# Patient Record
Sex: Male | Born: 1980 | Race: White | Hispanic: No | Marital: Married | Smoking: Current every day smoker
Health system: Southern US, Community
[De-identification: ages and names within clinical notes are randomized; demographics above are authoritative.]

## PROBLEM LIST (undated history)

## (undated) DIAGNOSIS — F319 Bipolar disorder, unspecified: Secondary | ICD-10-CM

## (undated) DIAGNOSIS — F329 Major depressive disorder, single episode, unspecified: Secondary | ICD-10-CM

## (undated) DIAGNOSIS — F419 Anxiety disorder, unspecified: Secondary | ICD-10-CM

## (undated) DIAGNOSIS — F32A Depression, unspecified: Secondary | ICD-10-CM

## (undated) HISTORY — DX: Depression, unspecified: F32.A

## (undated) HISTORY — DX: Bipolar disorder, unspecified: F31.9

## (undated) HISTORY — DX: Anxiety disorder, unspecified: F41.9

## (undated) HISTORY — DX: Major depressive disorder, single episode, unspecified: F32.9

## (undated) HISTORY — PX: ABDOMINAL SURGERY: SHX537

---
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
## 2011-09-17 ENCOUNTER — Emergency Department: Payer: Self-pay | Admitting: Emergency Medicine

## 2011-09-17 LAB — ETHANOL: Ethanol: <3 mg/dL

## 2011-09-17 LAB — COMPREHENSIVE METABOLIC PANEL
Albumin: 4.1 g/dL (ref 3.4–5.0)
Calcium, Total: 8.9 mg/dL (ref 8.5–10.1)
Chloride: 108 mmol/L — ABNORMAL HIGH (ref 98–107)
Co2: 26 mmol/L (ref 21–32)
Creatinine: 1.25 mg/dL (ref 0.60–1.30)
EGFR (African American): >60
Osmolality: 283 (ref 275–301)
Sodium: 140 mmol/L (ref 136–145)
Total Protein: 7.3 g/dL (ref 6.4–8.2)

## 2011-09-17 LAB — SALICYLATE LEVEL: Salicylates, Serum: 6.4 mg/dL — ABNORMAL HIGH

## 2011-09-17 LAB — DRUG SCREEN, URINE
Amphetamines, Ur Screen: NEGATIVE (ref ?–1000)
Barbiturates, Ur Screen: NEGATIVE (ref ?–200)
Opiate, Ur Screen: NEGATIVE (ref ?–300)
Tricyclic, Ur Screen: NEGATIVE (ref ?–1000)

## 2011-09-17 LAB — ACETAMINOPHEN LEVEL: Acetaminophen: <2 ug/mL

## 2011-09-17 LAB — TSH: Thyroid Stimulating Horm: 1.05 u[IU]/mL

## 2011-09-17 LAB — CBC
HCT: 43.3 % (ref 40.0–52.0)
MCH: 32.3 pg (ref 26.0–34.0)

## 2014-08-19 DIAGNOSIS — F418 Other specified anxiety disorders: Secondary | ICD-10-CM | POA: Insufficient documentation

## 2014-08-19 DIAGNOSIS — F319 Bipolar disorder, unspecified: Secondary | ICD-10-CM | POA: Insufficient documentation

## 2014-08-19 DIAGNOSIS — F419 Anxiety disorder, unspecified: Secondary | ICD-10-CM

## 2014-08-24 ENCOUNTER — Ambulatory Visit: Payer: Self-pay | Admitting: Unknown Physician Specialty

## 2014-08-31 ENCOUNTER — Ambulatory Visit: Payer: Self-pay | Admitting: Unknown Physician Specialty

## 2014-09-01 ENCOUNTER — Ambulatory Visit: Payer: Self-pay | Admitting: Unknown Physician Specialty

## 2014-09-10 ENCOUNTER — Ambulatory Visit: Payer: Self-pay | Admitting: Family Medicine

## 2014-10-11 ENCOUNTER — Inpatient Hospital Stay
Admission: EM | Admit: 2014-10-11 | Discharge: 2014-10-11 | DRG: 380 | Payer: Self-pay | Attending: Internal Medicine | Admitting: Internal Medicine

## 2014-10-11 ENCOUNTER — Encounter: Payer: Self-pay | Admitting: Emergency Medicine

## 2014-10-11 ENCOUNTER — Inpatient Hospital Stay: Payer: Self-pay

## 2014-10-11 ENCOUNTER — Ambulatory Visit (HOSPITAL_COMMUNITY): Admission: AD | Admit: 2014-10-11 | Payer: Self-pay | Source: Other Acute Inpatient Hospital | Admitting: *Deleted

## 2014-10-11 ENCOUNTER — Ambulatory Visit (HOSPITAL_COMMUNITY)
Admission: AD | Admit: 2014-10-11 | Discharge: 2014-10-11 | Disposition: A | Discharge status: Home / Self Care | Payer: MEDICAID | Source: Other Acute Inpatient Hospital | Attending: Internal Medicine | Admitting: Internal Medicine

## 2014-10-11 DIAGNOSIS — N10 Acute tubulo-interstitial nephritis: Secondary | ICD-10-CM | POA: Diagnosis present

## 2014-10-11 DIAGNOSIS — N12 Tubulo-interstitial nephritis, not specified as acute or chronic: Secondary | ICD-10-CM | POA: Diagnosis present

## 2014-10-11 DIAGNOSIS — F17210 Nicotine dependence, cigarettes, uncomplicated: Secondary | ICD-10-CM | POA: Diagnosis present

## 2014-10-11 DIAGNOSIS — R000 Tachycardia, unspecified: Secondary | ICD-10-CM | POA: Diagnosis present

## 2014-10-11 DIAGNOSIS — E876 Hypokalemia: Secondary | ICD-10-CM | POA: Diagnosis present

## 2014-10-11 DIAGNOSIS — F319 Bipolar disorder, unspecified: Secondary | ICD-10-CM | POA: Diagnosis present

## 2014-10-11 DIAGNOSIS — K265 Chronic or unspecified duodenal ulcer with perforation: Principal | ICD-10-CM | POA: Diagnosis present

## 2014-10-11 DIAGNOSIS — A419 Sepsis, unspecified organism: Secondary | ICD-10-CM | POA: Diagnosis present

## 2014-10-11 DIAGNOSIS — I81 Portal vein thrombosis: Secondary | ICD-10-CM | POA: Diagnosis present

## 2014-10-11 DIAGNOSIS — B9789 Other viral agents as the cause of diseases classified elsewhere: Secondary | ICD-10-CM | POA: Diagnosis present

## 2014-10-11 DIAGNOSIS — I959 Hypotension, unspecified: Secondary | ICD-10-CM | POA: Diagnosis present

## 2014-10-11 LAB — CBC WITH DIFFERENTIAL/PLATELET
BASOS ABS: 0 10*3/uL (ref 0–0.1)
BASOS PCT: 0 %
EOS PCT: 0 %
Eosinophils Absolute: 0 10*3/uL (ref 0–0.7)
HCT: 35.8 % — ABNORMAL LOW (ref 40.0–52.0)
Hemoglobin: 12.5 g/dL — ABNORMAL LOW (ref 13.0–18.0)
LYMPHS PCT: 1 %
Lymphs Abs: 0.1 10*3/uL — ABNORMAL LOW (ref 1.0–3.6)
MCH: 30.6 pg (ref 26.0–34.0)
MCHC: 34.9 g/dL (ref 32.0–36.0)
MCV: 87.6 fL (ref 80.0–100.0)
Monocytes Absolute: 0 10*3/uL — ABNORMAL LOW (ref 0.2–1.0)
Monocytes Relative: 0 %
Neutro Abs: 12 10*3/uL — ABNORMAL HIGH (ref 1.4–6.5)
Neutrophils Relative %: 99 %
Platelets: 249 10*3/uL (ref 150–440)
RBC: 4.08 MIL/uL — ABNORMAL LOW (ref 4.40–5.90)
RDW: 14.1 % (ref 11.5–14.5)
WBC: 12.2 10*3/uL — ABNORMAL HIGH (ref 3.8–10.6)

## 2014-10-11 LAB — BASIC METABOLIC PANEL
Anion gap: 17 — ABNORMAL HIGH (ref 5–15)
BUN: 11 mg/dL (ref 6–20)
CHLORIDE: 90 mmol/L — AB (ref 101–111)
CO2: 27 mmol/L (ref 22–32)
Calcium: 8 mg/dL — ABNORMAL LOW (ref 8.9–10.3)
Creatinine, Ser: 0.82 mg/dL (ref 0.61–1.24)
GFR calc Af Amer: >60 mL/min (ref >60)
GFR calc non Af Amer: >60 mL/min (ref >60)
Glucose, Bld: 84 mg/dL (ref 65–99)
Potassium: 2.4 mmol/L — CL (ref 3.5–5.1)
Sodium: 134 mmol/L — ABNORMAL LOW (ref 135–145)

## 2014-10-11 LAB — URINALYSIS COMPLETE WITH MICROSCOPIC (ARMC ONLY)
GLUCOSE, UA: NEGATIVE mg/dL
KETONES UR: NEGATIVE mg/dL
Leukocytes, UA: NEGATIVE
NITRITE: NEGATIVE
PH: 5 (ref 5.0–8.0)
Protein, ur: 30 mg/dL — AB
SPECIFIC GRAVITY, URINE: 1.014 (ref 1.005–1.030)

## 2014-10-11 LAB — MAGNESIUM: Magnesium: 1.4 mg/dL — ABNORMAL LOW (ref 1.7–2.4)

## 2014-10-11 MED ORDER — POTASSIUM CHLORIDE 20 MEQ/15ML (10%) PO SOLN
20.0000 meq | Freq: Two times a day (BID) | ORAL | Status: DC
  Filled 2014-10-11 (×2): qty 15

## 2014-10-11 MED ORDER — IOHEXOL 350 MG/ML SOLN
100.0000 mL | Freq: Once | INTRAVENOUS | Status: AC | PRN
  Administered 2014-10-11: 100 mL via INTRAVENOUS

## 2014-10-11 MED ORDER — SODIUM CHLORIDE 0.9 % IV SOLN
8.0000 mg/h | INTRAVENOUS | Status: DC
  Administered 2014-10-11: 8 mg/h via INTRAVENOUS
  Filled 2014-10-11: qty 80

## 2014-10-11 MED ORDER — MORPHINE SULFATE 4 MG/ML IJ SOLN
INTRAMUSCULAR | Status: AC
  Administered 2014-10-11: 4 mg via INTRAVENOUS
  Filled 2014-10-11: qty 1

## 2014-10-11 MED ORDER — FLUCONAZOLE IN SODIUM CHLORIDE 400-0.9 MG/200ML-% IV SOLN
400.0000 mg | Freq: Once | INTRAVENOUS | Status: DC
  Filled 2014-10-11: qty 200

## 2014-10-11 MED ORDER — SODIUM CHLORIDE 0.9 % IJ SOLN: 3.0000 mL | Freq: Two times a day (BID) | INTRAMUSCULAR | Status: DC

## 2014-10-11 MED ORDER — ONDANSETRON HCL 4 MG PO TABS: 4.0000 mg | ORAL_TABLET | Freq: Four times a day (QID) | ORAL | Status: DC | PRN

## 2014-10-11 MED ORDER — ACETAMINOPHEN 325 MG PO TABS: 650.0000 mg | ORAL_TABLET | Freq: Four times a day (QID) | ORAL | Status: DC | PRN

## 2014-10-11 MED ORDER — ONDANSETRON HCL 4 MG/2ML IJ SOLN
4.0000 mg | Freq: Once | INTRAMUSCULAR | Status: AC
  Administered 2014-10-11: 4 mg via INTRAVENOUS

## 2014-10-11 MED ORDER — POTASSIUM CHLORIDE IN NACL 20-0.9 MEQ/L-% IV SOLN
INTRAVENOUS | Status: DC
  Administered 2014-10-11: 10:00:00 via INTRAVENOUS
  Filled 2014-10-11 (×4): qty 1000

## 2014-10-11 MED ORDER — SODIUM CHLORIDE 0.9 % IV BOLUS (SEPSIS)
1000.0000 mL | Freq: Once | INTRAVENOUS | Status: AC
Start: 2014-10-11 — End: 2014-10-11
  Administered 2014-10-11: 1000 mL via INTRAVENOUS

## 2014-10-11 MED ORDER — CLONAZEPAM 0.5 MG PO TABS: 0.5000 mg | ORAL_TABLET | Freq: Three times a day (TID) | ORAL | Status: DC | PRN

## 2014-10-11 MED ORDER — PIPERACILLIN-TAZOBACTAM 3.375 G IVPB
3.3750 g | Freq: Four times a day (QID) | INTRAVENOUS | Status: DC
  Administered 2014-10-11: 3.375 g via INTRAVENOUS
  Filled 2014-10-11 (×5): qty 50

## 2014-10-11 MED ORDER — ACETAMINOPHEN 650 MG RE SUPP: 650.0000 mg | Freq: Four times a day (QID) | RECTAL | Status: DC | PRN

## 2014-10-11 MED ORDER — CITALOPRAM HYDROBROMIDE 20 MG PO TABS: 40.0000 mg | ORAL_TABLET | Freq: Every day | ORAL | Status: DC

## 2014-10-11 MED ORDER — MORPHINE SULFATE 4 MG/ML IJ SOLN
4.0000 mg | Freq: Once | INTRAMUSCULAR | Status: AC
  Administered 2014-10-11: 4 mg via INTRAVENOUS

## 2014-10-11 MED ORDER — ONDANSETRON HCL 4 MG/2ML IJ SOLN
4.0000 mg | Freq: Four times a day (QID) | INTRAMUSCULAR | Status: DC | PRN
  Administered 2014-10-11: 4 mg via INTRAVENOUS

## 2014-10-11 MED ORDER — LITHIUM CARBONATE ER 300 MG PO TBCR: 300.0000 mg | EXTENDED_RELEASE_TABLET | Freq: Two times a day (BID) | ORAL | Status: DC

## 2014-10-11 MED ORDER — IOHEXOL 240 MG/ML SOLN
25.0000 mL | Freq: Once | INTRAMUSCULAR | Status: AC | PRN
  Administered 2014-10-11: 25 mL via ORAL

## 2014-10-11 MED ORDER — LACTATED RINGERS IV BOLUS (SEPSIS)
1000.0000 mL | Freq: Once | INTRAVENOUS | Status: AC
  Administered 2014-10-11: 1000 mL via INTRAVENOUS
  Filled 2014-10-11: qty 1000

## 2014-10-11 MED ORDER — DEXTROSE 5 % IV SOLN
1.0000 g | Freq: Once | INTRAVENOUS | Status: AC
  Administered 2014-10-11: 1 g via INTRAVENOUS
  Filled 2014-10-11: qty 10

## 2014-10-11 MED ORDER — PANTOPRAZOLE SODIUM 40 MG IV SOLR: 40.0000 mg | Freq: Two times a day (BID) | INTRAVENOUS | Status: DC

## 2014-10-11 MED ORDER — ONDANSETRON HCL 4 MG/2ML IJ SOLN
INTRAMUSCULAR | Status: AC
  Administered 2014-10-11: 4 mg via INTRAVENOUS
  Filled 2014-10-11: qty 2

## 2014-10-11 MED ORDER — HYDROCODONE-ACETAMINOPHEN 5-325 MG PO TABS
1.0000 | ORAL_TABLET | ORAL | Status: DC | PRN
  Administered 2014-10-11 (×2): 1 via ORAL
  Filled 2014-10-11 (×2): qty 1

## 2014-10-11 MED ORDER — SENNOSIDES-DOCUSATE SODIUM 8.6-50 MG PO TABS: 1.0000 | ORAL_TABLET | Freq: Every evening | ORAL | Status: DC | PRN

## 2014-10-11 MED ORDER — ALUM & MAG HYDROXIDE-SIMETH 200-200-20 MG/5ML PO SUSP: 30.0000 mL | Freq: Four times a day (QID) | ORAL | Status: DC | PRN

## 2014-10-11 MED ORDER — SODIUM CHLORIDE 0.9 % IV BOLUS (SEPSIS)
1000.0000 mL | Freq: Once | INTRAVENOUS | Status: AC
  Administered 2014-10-11: 1000 mL via INTRAVENOUS

## 2014-10-11 MED ORDER — HEPARIN SODIUM (PORCINE) 5000 UNIT/ML IJ SOLN: 5000.0000 [IU] | Freq: Three times a day (TID) | INTRAMUSCULAR | Status: DC

## 2014-10-11 MED ORDER — POTASSIUM CHLORIDE CRYS ER 20 MEQ PO TBCR
40.0000 meq | EXTENDED_RELEASE_TABLET | Freq: Once | ORAL | Status: AC
  Administered 2014-10-11: 40 meq via ORAL
  Filled 2014-10-11: qty 2

## 2014-10-11 MED ORDER — ONDANSETRON HCL 4 MG/2ML IJ SOLN
INTRAMUSCULAR | Status: AC
Start: 2014-10-11 — End: 2014-10-11
  Filled 2014-10-11: qty 2

## 2014-10-11 MED ORDER — PANTOPRAZOLE SODIUM 40 MG PO TBEC: 40.0000 mg | DELAYED_RELEASE_TABLET | Freq: Every day | ORAL | Status: DC

## 2014-10-11 MED ORDER — SODIUM CHLORIDE 0.9 % IV SOLN
80.0000 mg | Freq: Once | INTRAVENOUS | Status: AC
  Administered 2014-10-11: 80 mg via INTRAVENOUS
  Filled 2014-10-11: qty 80

## 2014-10-11 NOTE — ED Notes (Signed)
Dr. Egbert Garibaldi at bedside assessing patient.

## 2014-10-11 NOTE — ED Provider Notes (Signed)
[facility] Emergency Department Provider Note   ____________________________________________  Time seen: 0615  I have reviewed the triage vital signs and the nursing notes.   HISTORY  Chief Complaint Back Pain   History limited by: Not Limited   HPI Tristan Harrington is a 34 y.o. male who presents to the emergency department today with bilateral flank pain. He states that this started suddenly last night. He states the pain is severe. It has been constant. He states that he had similar symptoms a couple of months ago. He was admitted at that time for pyelonephritis. He states that they were not sure why he got a lateral pyelonephritis. He states since discharge he did finish a course of antibiotics and have been doing okay.   Past Medical History  Diagnosis Date  . Anxiety   . Bipolar disorder   . Depression     Patient Active Problem List   Diagnosis Date Noted  . Bipolar disorder 08/19/2014  . Depression with anxiety 08/19/2014  . Acute anxiety 08/19/2014    History reviewed. No pertinent past surgical history.  Current Outpatient Rx  Name  Route  Sig  Dispense  Refill  . citalopram (CELEXA) 40 MG tablet   Oral   Take 40 mg by mouth at bedtime.         . clonazePAM (KLONOPIN) 0.5 MG tablet   Oral   Take 0.5 mg by mouth 3 (three) times daily as needed for anxiety.         Marland Kitchen lithium carbonate (LITHOBID) 300 MG CR tablet   Oral   Take 300 mg by mouth 2 (two) times daily.         Marland Kitchen omeprazole (PRILOSEC) 20 MG capsule   Oral   Take 20 mg by mouth daily.           Allergies Review of patient's allergies indicates no known allergies.  History reviewed. No pertinent family history.  Social History History  Substance Use Topics  . Smoking status: Current Every Day Smoker -- 0.75 packs/day  . Smokeless tobacco: Not on file  . Alcohol Use: No    Review of Systems  Constitutional: Negative for fever. Cardiovascular:  Negative for chest pain. Respiratory: Negative for shortness of breath. Gastrointestinal: Bilateral flank pain Genitourinary: Negative for dysuria. Musculoskeletal: Negative for back pain. Skin: Negative for rash. Neurological: Negative for headaches, focal weakness or numbness.  10-point ROS otherwise negative.  ____________________________________________   PHYSICAL EXAM:  VITAL SIGNS: ED Triage Vitals  Enc Vitals Group     BP 10/11/14 0332 123/64 mmHg     Pulse Rate 10/11/14 0332 133     Resp 10/11/14 0336 19     Temp 10/11/14 0336 99.9 F (37.7 C)     Temp Source 10/11/14 0336 Oral     SpO2 10/11/14 0332 92 %     Weight 10/11/14 0336 190 lb (86.183 kg)     Height 10/11/14 0336 6\' 2"  (1.88 m)     Head Cir --      Peak Flow --      Pain Score 10/11/14 0337 10   Constitutional: Alert and oriented. Appears ill Eyes: Conjunctivae are normal. PERRL. Normal extraocular movements. ENT   Head: Normocephalic and atraumatic.   Nose: No congestion/rhinnorhea.   Mouth/Throat: Mucous membranes are moist.   Neck: No stridor. Hematological/Lymphatic/Immunilogical: No cervical lymphadenopathy. Cardiovascular: Tachycardic, regular rhythm.  No murmurs, rubs, or gallops. Respiratory: Normal respiratory effort without tachypnea nor retractions.  Breath sounds are clear and equal bilaterally. No wheezes/rales/rhonchi. Gastrointestinal: Soft and minimally tender. Bilateral CVA tenderness. Genitourinary: Deferred Musculoskeletal: Normal range of motion in all extremities. No joint effusions.  No lower extremity tenderness nor edema. Neurologic:  Normal speech and language. No gross focal neurologic deficits are appreciated. Speech is normal.  Skin:  Skin is warm, dry and intact. No rash noted. Psychiatric: Mood and affect are normal. Speech and behavior are normal. Patient exhibits appropriate insight and judgment.  ____________________________________________    LABS  (pertinent positives/negatives)  Labs Reviewed  CBC WITH DIFFERENTIAL/PLATELET - Abnormal; Notable for the following:    WBC 12.2 (*)    RBC 4.08 (*)    Hemoglobin 12.5 (*)    HCT 35.8 (*)    Neutro Abs 12.0 (*)    Lymphs Abs 0.1 (*)    Monocytes Absolute 0.0 (*)    All other components within normal limits  BASIC METABOLIC PANEL - Abnormal; Notable for the following:    Sodium 134 (*)    Potassium 2.4 (*)    Chloride 90 (*)    Calcium 8.0 (*)    Anion gap 17 (*)    All other components within normal limits  URINALYSIS COMPLETEWITH MICROSCOPIC ([facility] ONLY) - Abnormal; Notable for the following:    Color, Urine AMBER (*)    APPearance CLOUDY (*)    Bilirubin Urine 2+ (*)    Hgb urine dipstick 1+ (*)    Protein, ur 30 (*)    Bacteria, UA RARE (*)    Squamous Epithelial / LPF 0-5 (*)    All other components within normal limits  URINE CULTURE     ____________________________________________   EKG  None  ____________________________________________    RADIOLOGY  None  ____________________________________________   PROCEDURES  Procedure(s) performed: None  Critical Care performed: No  ____________________________________________   INITIAL IMPRESSION / ASSESSMENT AND PLAN / ED COURSE  Pertinent labs & imaging results that were available during my care of the patient were reviewed by me and considered in my medical decision making (see chart for details).  Patient presents with bilateral flank pain. Urine is concerning for infection. Physical exam concerning for pyelonephritis. Patient had pyelonephritis a couple of months ago. At this point will start IV antibiotics give fluids and plan on admission to the hospital.  ____________________________________________   FINAL CLINICAL IMPRESSION(S) / ED DIAGNOSES  Final diagnoses:  Pyelonephritis  Sepsis, due to unspecified organism     Phineas Semen, MD 10/11/14 [phone]

## 2014-10-11 NOTE — ED Notes (Signed)
Report called to floor.  Will transfer patient to floor after CT scan.  Patient informed.

## 2014-10-11 NOTE — ED Notes (Signed)
New orders placed, await NS 1L bolus infusing, will call Dr Mitzi Hansen after bolus.

## 2014-10-11 NOTE — Progress Notes (Signed)
Patient ID: Tristan Harrington, male   DOB: Dec 17, 1980, 34 y.o.   MRN: [id number]   I personally reviewed CT scan. 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 Oral contrast seems limited but was administered. There is a small amount of microperforation present seen within the duodenal sweep.  He currently is feeling better.  Distally blood pressure is 108 mm hg.  Exam is unchanged.   Multiple family members are present.  Discussed this case with the hospitalist. Given the fact of the delay in diagnosis from the initial CT scan as well as a portal vein thrombosis and young age I recommended to the hospitalist service transfer to [facility] medical centers for tertiary level care.  As I believe that he does not requiring urgent operation for duodenal perforation he certainly needs to be anticoagulated for the portal vein thrombosis and this certainly may complicate things here. Both patient and family and hospitalist are in agreement.  All questions were answered of the family.

## 2014-10-11 NOTE — ED Notes (Signed)
Pt to ED via EMS from home c/o bilateral kidney and back pain.  Pt states awoken from sleep tonight around 0215 with sharp bilateral kidney pain and generalized back pain.  Reports nausea but denies vomiting or diarrhea.  Pt reports recent kidney infection on July 1st and finished antibiotic treatment.  EMS given 650 tylenol and NS en route.  Pt is A&Ox4, speaking in complete and coherent sentences and in NAD at this time.

## 2014-10-11 NOTE — ED Notes (Signed)
Radiology called with critical value, perforated duodenal ulcer, notified Dr Juliene Pina.  New orders recd.

## 2014-10-11 NOTE — H&P (Addendum)
[facility] [place] at [facility]   PATIENT NAME: Tristan Harrington    MR#:  [id number]  DATE OF BIRTH:  1980-08-20  DATE OF ADMISSION:  10/11/2014  PRIMARY CARE PHYSICIAN: No primary care provider on file.   REQUESTING/REFERRING PHYSICIAN: Dr. Derrill Kay  CHIEF COMPLAINT:  Back pain HISTORY OF PRESENT ILLNESS:  Tristan Harrington  is a 34 y.o. male with a known history of pyelonephritis and tobacco dependence who presents with above complaint. Patient reports that since last night he had bilateral back pain and dysuria. He was recently admitted to the hospital with pyelonephritis and has finished his antibiotics since then. In the emergency room it was noted he has low blood pressure and was started on Rocephin.  PAST MEDICAL HISTORY:   Past Medical History  Diagnosis Date  . Anxiety   . Bipolar disorder   . Depression     PAST SURGICAL HISTORY:  None  SOCIAL HISTORY:   History  Substance Use Topics  . Smoking status: Current Every Day Smoker -- 0.75 packs/day  . Smokeless tobacco: Not on file  . Alcohol Use: No    FAMILY HISTORY:  No history of CAD, CVA or hypertension  DRUG ALLERGIES:  No Known Allergies   REVIEW OF SYSTEMS:  CONSTITUTIONAL: No fever, fatigue or weakness.  EYES: No blurred or double vision.  EARS, NOSE, AND THROAT: No tinnitus or ear pain.  RESPIRATORY: No cough, shortness of breath, wheezing or hemoptysis.  CARDIOVASCULAR: No chest pain, orthopnea, edema.  GASTROINTESTINAL: No nausea, vomiting, diarrhea or abdominal pain.  GENITOURINARY: Positive dysuria, no hematuria.  ENDOCRINE: No polyuria, nocturia,  HEMATOLOGY: No anemia, easy bruising or bleeding SKIN: No rash or lesion. MUSCULOSKELETAL: No joint pain or arthritis.  Positive bilateral back pain   NEUROLOGIC: No tingling, numbness, weakness.  PSYCHIATRY: Positive for bipolar not on medications  MEDICATIONS AT HOME:      VITAL SIGNS:  Blood pressure 91/64,  pulse 106, temperature 99.9 F (37.7 C), temperature source Oral, resp. rate 22, height 6\' 2"  (1.88 m), weight 86.183 kg (190 lb), SpO2 96 %.  PHYSICAL EXAMINATION:  GENERAL:  34 y.o.-year-old patient lying in the bed with no acute distress.  EYES: Pupils equal, round, reactive to light and accommodation. No scleral icterus. Extraocular muscles intact.  HEENT: Head atraumatic, normocephalic. Oropharynx and nasopharynx clear.  NECK:  Supple, no jugular venous distention. No thyroid enlargement, no tenderness.  LUNGS: Normal breath sounds bilaterally, no wheezing, rales,rhonchi or crepitation. No use of accessory muscles of respiration.  CARDIOVASCULAR: S1, S2 normal. No murmurs, rubs, or gallops.  ABDOMEN: Soft, nontender, nondistended. Bowel sounds present. No organomegaly or mass. Positive bilateral CVA tenderness EXTREMITIES: No pedal edema, cyanosis, or clubbing.  NEUROLOGIC: Cranial nerves II through XII are grossly intact. No focal deficits. PSYCHIATRIC: The patient is alert and oriented x 3.  SKIN: No obvious rash, lesion, or ulcer.   LABORATORY PANEL:   CBC  Recent Labs Lab 10/11/14 0340  WBC 12.2*  HGB 12.5*  HCT 35.8*  PLT 249   ------------------------------------------------------------------------------------------------------------------  Chemistries   Recent Labs Lab 10/11/14 0340  NA 134*  K 2.4*  CL 90*  CO2 27  GLUCOSE 84  BUN 11  CREATININE 0.82  CALCIUM 8.0*   ------------------------------------------------------------------------------------------------------------------  Cardiac Enzymes No results for input(s): TROPONINI in the last 168 hours. ------------------------------------------------------------------------------------------------------------------  RADIOLOGY:  No results found.  EKG:    IMPRESSION AND PLAN:  34 year old male who was recently treated for acute polynephritis who  presents again with bilateral acute pyelonephritis  and sepsis.  1. Sepsis: Cyst likely secondary to acute pyelonephritis. Patient continues to have low blood pressure but is asymptomatic from his hypotension. His map is above 65. I will continue aggressive IV hydration. Patient has received 3 L of normal saline. Patient will be on antibiotics for his pyelonephritis. Due to his low blood pressure I will order CT scan of abdomen/pelvis.  2. Pyelonephritis: It is unclear why the patient has this again. He is currently on Rocephin which I'll continue. Urine and blood cultures been ordered. Patient will require a urology follow-up after discharge as this is his second case of pyelonephritis.  3. Tobacco dependence: Patient was encouraged to stop smoking. Patient does not want nicotine patch at this time. Patient was counseled for 3 minutes.  4. Tachycardia: This is secondary to sepsis. Continue to monitor.    5. Hypokalemia: Patient's potassium will be repleted. Magnesium level also checked.    All the records are reviewed and case discussed with ED provider. Management plans discussed with the patient and he is in agreement.  CODE STATUS: Full  TOTAL TIME TAKING CARE OF THIS PATIENT: 40 minutes.    Tristan Harrington M.D on 10/11/2014 at 10:23 AM  Between 7am to 6pm - Pager - [phone] After 6pm go to [email or web address] - password [email or web address]  [facility]  Office  [phone]  CC: Primary care physician; No primary care provider on file.

## 2014-10-11 NOTE — Progress Notes (Signed)
ER had attempted to admit this pt early this shift but BP was very low and pt was tachycardic. After fluid boluses BP improved according to the ER RN Viviann Spare and pt was to be admitted.Report taken from [facility], [place].Pt arrived from ER after a CT scan. VS checked , tele applied and admission profile set up. Pt was admitted for pyelonephritis but CT showed perforation and hepatic portal thrombosis per Dr Valla Leaver was admitted for pyelonephritis but CT showed perforation and hepatic portal thrombosis per Dr Egbert Garibaldi. RN received call from [facility] stating that they had a bed for the pt. RN was unaware of any transfer. Dr. Juliene Pina was called and she responded that she had put in an order to transfer that pt to stepdown at 0915. Order was seen but pt had not been admitted to floor until 1350. Also, the pt had a potassium of 2.4 which had not been addressed and a medication was ordered but it was unknown if it was given. Md entered order to transfer to [facility] Report called to [facility], SICU to [facility] was called for transport and report given to [place].All necessary paperwork and discs were obtained. Pt left via [facility] at 1545.

## 2014-10-11 NOTE — ED Notes (Signed)
Patient to CT scan via stretcher.  Ct will call once scan complete and will then transport patient to 2 C.

## 2014-10-11 NOTE — Consult Note (Signed)
Patient ID: Tristan Harrington, male   DOB: [birth year], 34 y.o.   MRN: [id number]  Chief Complaint  Patient presents with  . Back Pain    HPI  Tristan Harrington is a 34 y.o. male. Who is otherwise healthy up until 6 weeks ago when he presented to an outside hospital with sudden onset of upper back and upper abdominal pain with mild nausea and 1 episode of emesis. He was diagnosed with pyelonephritis and treated with intravenous antibiotics in the hospital for 3 days and subsequently discharged on a course or oral antibiotics.  He states that for approximately 10 days following his discharge she felt poorly.  In the last day of his antibiotics the patient had nausea and vomiting and recurrent abdominal pain. Since that time he's not been able to work. He's had mild nausea. Eating spicy foods makes his abdomen hurt however blander and/or dairy products makes it better.   Ten (10) days later the patient was only able to work one day as he started having significant pain is back again along with mild nausea and anorexia essentially laid in bed for a week. Since that time the patient has not felt well.    Early this morning he had the sudden onset of similar type pain to that of 6 weeks ago with mild nausea and 1 episode of emesis and was brought to the emergency room. Urinalysis demonstrates significant leukocyturia. He was admitted by the medical service with again the diagnosis of pyelonephritis. He had some mild hypotension and as such a renal protocol CT scan was obtained to rule out hydronephrosis with obstructing stone.  Review of that CT scan shows no stone however there is evidence of small amount of air around the duodenum and right retroperitoneal space. And around portal vein area.   In the review of this current CT scan compared to the CT scan from 6 weeks ago demonstrates there was small locules of air and similar location which was not reported at that time.   The patient denies any alcohol intake.  No NSAID abuse. No other over-the-counter medications reported. He works as a Development worker, international aid however in the last 6 weeks she's only been able to work one or 2 days. He is accompanied by his grandmother His only past surgical history is that of an inguinal hernia repair age 8.  Past Medical History  Diagnosis Date  . Anxiety   . Bipolar disorder   . Depression     Past surgical history significant for hernia repair at age 26.  Tristan Harrington history significant for hypertension and cigarette abuse.  Social History History  Substance Use Topics  . Smoking status: Current Every Day Smoker -- 0.75 packs/day  . Smokeless tobacco: Not on file  . Alcohol Use: No    No Known Allergies  Current Facility-Administered Medications  Medication Dose Route Frequency Provider Last Rate Last Dose  . 0.9 % NaCl with KCl 20 mEq/ L  infusion   Intravenous Continuous Bettey Costa, MD 125 mL/hr at 10/11/14 1027    . HYDROcodone-acetaminophen (NORCO/VICODIN) 5-325 MG per tablet 1-2 tablet  1-2 tablet Oral Q4H PRN Bettey Costa, MD   1 tablet at 10/11/14 1023  . lactated ringers bolus 1,000 mL  1,000 mL Intravenous Once Sherri Rad, MD      . pantoprazole (PROTONIX) 80 mg in sodium chloride 0.9 % 100 mL IVPB  80 mg Intravenous Once Sherri Rad, MD      . pantoprazole (PROTONIX) 80 mg  in sodium chloride 0.9 % 250 mL (0.32 mg/mL) infusion  8 mg/hr Intravenous Continuous Sherri Rad, MD      . Derrill Memo ON 10/15/2014] pantoprazole (PROTONIX) injection 40 mg  40 mg Intravenous Q12H Sherri Rad, MD      . piperacillin-tazobactam (ZOSYN) IVPB 3.375 g  3.375 g Intravenous 4 times per day Bettey Costa, MD 12.5 mL/hr at 10/11/14 1155 3.375 g at 10/11/14 1155  . potassium chloride 20 MEQ/15ML (10%) solution 20 mEq  20 mEq Oral BID Bettey Costa, MD       Current Outpatient Prescriptions  Medication Sig Dispense Refill  . acetaminophen (TYLENOL) 325 MG tablet Take 1,075 mg by mouth every 6 (six) hours as needed for mild pain, moderate pain or  headache.    Marland Kitchen omeprazole (PRILOSEC) 20 MG capsule Take 20 mg by mouth 2 (two) times daily.           Blood pressure 99/81, pulse 108, temperature 99.9 F (37.7 C), temperature source Oral, resp. rate 18, height 6' 2"  (1.88 m), weight 86.183 kg (190 lb), SpO2 97 %.  Results for orders placed or performed during the hospital encounter of 10/11/14 (from the past 48 hour(s))  CBC with Differential     Status: Abnormal   Collection Time: 10/11/14  3:40 AM  Result Value Ref Range   WBC 12.2 (H) 3.8 - 10.6 K/uL   RBC 4.08 (L) 4.40 - 5.90 MIL/uL   Hemoglobin 12.5 (L) 13.0 - 18.0 g/dL   HCT 35.8 (L) 40.0 - 52.0 %   MCV 87.6 80.0 - 100.0 fL   MCH 30.6 26.0 - 34.0 pg   MCHC 34.9 32.0 - 36.0 g/dL   RDW 14.1 11.5 - 14.5 %   Platelets 249 150 - 440 K/uL   Neutrophils Relative % 99 %   Neutro Abs 12.0 (H) 1.4 - 6.5 K/uL   Lymphocytes Relative 1 %   Lymphs Abs 0.1 (L) 1.0 - 3.6 K/uL   Monocytes Relative 0 %   Monocytes Absolute 0.0 (L) 0.2 - 1.0 K/uL   Eosinophils Relative 0 %   Eosinophils Absolute 0.0 0 - 0.7 K/uL   Basophils Relative 0 %   Basophils Absolute 0.0 0 - 0.1 K/uL  Basic metabolic panel     Status: Abnormal   Collection Time: 10/11/14  3:40 AM  Result Value Ref Range   Sodium 134 (L) 135 - 145 mmol/L   Potassium 2.4 (LL) 3.5 - 5.1 mmol/L    Comment: CRITICAL RESULT CALLED TO, READ BACK BY AND VERIFIED WITH RACHEL HAYDEN AT 0426 ON 10/10/14 RWW    Chloride 90 (L) 101 - 111 mmol/L   CO2 27 22 - 32 mmol/L   Glucose, Bld 84 65 - 99 mg/dL   BUN 11 6 - 20 mg/dL   Creatinine, Ser 0.82 0.61 - 1.24 mg/dL   Calcium 8.0 (L) 8.9 - 10.3 mg/dL   GFR calc non Af Amer >60 >60 mL/min   GFR calc Af Amer >60 >60 mL/min    Comment: (NOTE) The eGFR has been calculated using the CKD EPI equation. This calculation has not been validated in all clinical situations. eGFR's persistently <60 mL/min signify possible Chronic Kidney Disease.    Anion gap 17 (H) 5 - 15  Urinalysis complete,  with microscopic ([facility] only)     Status: Abnormal   Collection Time: 10/11/14  4:00 AM  Result Value Ref Range   Color, Urine AMBER (A) YELLOW   APPearance CLOUDY (A)  CLEAR   Glucose, UA NEGATIVE NEGATIVE mg/dL   Bilirubin Urine 2+ (A) NEGATIVE   Ketones, ur NEGATIVE NEGATIVE mg/dL   Specific Gravity, Urine 1.014 1.005 - 1.030   Hgb urine dipstick 1+ (A) NEGATIVE   pH 5.0 5.0 - 8.0   Protein, ur 30 (A) NEGATIVE mg/dL   Nitrite NEGATIVE NEGATIVE   Leukocytes, UA NEGATIVE NEGATIVE   RBC / HPF 0-5 0 - 5 RBC/hpf   WBC, UA TOO NUMEROUS TO COUNT 0 - 5 WBC/hpf   Bacteria, UA RARE (A) NONE SEEN   Squamous Epithelial / LPF 0-5 (A) NONE SEEN   WBC Clumps PRESENT    Mucous PRESENT    Hyaline Casts, UA PRESENT    Granular Casts, UA PRESENT    Ct Renal Stone Study  10/11/2014   CLINICAL DATA:  BILATERAL flank pain beginning suddenly last night, severe constant pain, similar symptoms several months ago, admitted for pyelonephritis  EXAM: CT ABDOMEN AND PELVIS WITHOUT CONTRAST  TECHNIQUE: Multidetector CT imaging of the abdomen and pelvis was performed following the standard protocol without IV contrast. Sagittal and coronal MPR images reconstructed from axial data set.  COMPARISON:  09/02/2014  FINDINGS: Bibasilar atelectasis greater in RIGHT lower lobe.  No urinary tract calcification, hydronephrosis or ureteral dilatation.  Liver, spleen, pancreas, kidneys and adrenal glands otherwise unremarkable.  Stomach and bowel loops grossly unremarkable for technique.  Stranding in RIGHT anterior para renal space extending into periduodenal and peripancreatic fat as well as to the RIGHT lateral conal fascia.  Small foci of extraluminal gas at porta hepatis.  In retrospect, multiple tiny foci of retroperitoneal gas were seen in the anterior pararenal space adjacent to the duodenum on the previous exam.  Findings most consistent with a perforated duodenal ulcer.  No free intraperitoneal air.  No obvious abscess  collection though assessment is limited by lack of IV and oral contrast.  Bladder and ureters unremarkable.  Normal appendix.  Tiny amount of dependent free pelvic fluid.  No mass, adenopathy, hernia or acute bone lesion.  IMPRESSION: Diffuse stranding in the retroperitoneum adjacent to the duodenum and pancreas extending to the RIGHT lateral conal fascia, associated with foci of extraluminal gas at the porta hepatis as well as with in the periduodenal fat on the prior CT.  Findings are most consistent with a perforated duodenal ulcer.  No gross evidence of abscess identified on exam limited by lack of IV and oral contrast.  Findings called to [place] in [facility] ED on 10/11/2014 at 1105 hours.   Electronically Signed   By: Lavonia Dana M.D.   On: 10/11/2014 11:07    Review of Systems  Constitutional: Positive for malaise/fatigue. Negative for fever, chills and weight loss.  HENT: Negative.   Eyes: Negative.   Respiratory: Negative for cough and sputum production.   Cardiovascular: Negative for chest pain.  Gastrointestinal: Positive for heartburn, nausea, vomiting and abdominal pain. Negative for diarrhea, constipation, blood in stool and melena.  Genitourinary: Positive for urgency and frequency. Negative for dysuria.  Musculoskeletal: Positive for back pain.  Skin: Negative.   Neurological: Positive for weakness. Negative for headaches.  Endo/Heme/Allergies: Negative.   Psychiatric/Behavioral: Negative.   All other systems reviewed and are negative.   Physical Exam  Constitutional: He is oriented to person, place, and time. He appears distressed.  HENT:  Head: Normocephalic and atraumatic.  Eyes: Conjunctivae are normal. Pupils are equal, round, and reactive to light.  Neck: Normal range of motion.  Cardiovascular: Normal  rate, regular rhythm and normal heart sounds.   Pulmonary/Chest: Breath sounds normal. No respiratory distress. He has no wheezes.  Abdominal: Soft. He exhibits no  distension and no mass. There is tenderness. There is no rebound and no guarding.  The abdomen does not appear tympanitic or distended. There are no peritoneal signs. There is very mild tenderness to deep palpation within the epigastric and supraumbilical midline.  Musculoskeletal: He exhibits no edema.  Neurological: He is oriented to person, place, and time.  Skin: Skin is warm and dry.  Psychiatric: Mood, memory, affect and judgment normal.     Data Reviewed I personally reviewed both the CT scan from 6 weeks ago as well as the one done today on the PACS monitor. Furthermore I was able to review in the electronic medical record discharge summary from admission to [facility] and June.  I have personally reviewed the patient's imaging, laboratory findings and medical records.    Assessment     This is a 34 year old white male with a 6 week history of abdominal pain malaise and nausea and intermittent emesis with what appears to be duodenal ulcer disease. He had a microperforation 6 weeks ago and a second microperforation today. At present I do not see any overwhelming need for laparotomy. In fact reimaging with oral contrast and intravenous contrast will give me a better idea as to the extent of the issue and whether there is any extravasation of contrast. I believe that nonoperative management with nasogastric tube nothing by mouth intravenous Protonix and antibiotics is warranted.  I do not think that he has pyelonephritis but rather sterile pyuria secondary to a localized response around the right proximal ureter and kidney.    Plan    #1 nasogastric tube to LIWS #2 IV Protonix followed by Protonix drip. #3 I will repeat a CT scan with both oral and intravenous contrast and personally reviewed. #4 aggressively replete his potassium and rehydration. #5 serial abdominal examination. If his clinical condition deteriorates or hypotension persists he will require laparotomy.     I  discussed with the admitting physician Dr.Mody the details of my examination and plan. All were in agreement.  Time spent with patient was 80 minutes, with more than 50% of the time spent counseling and coordinating care of patient.     Sherri Rad MD, FACS 10/11/2014, 12:11 PM

## 2014-10-11 NOTE — Discharge Summary (Signed)
[facility] [place] at [facility]   PATIENT NAME: Tristan Harrington    MR#:  [id number]  DATE OF BIRTH:  1980/08/14  DATE OF ADMISSION:  10/11/2014  ADMITTING PHYSICIAN: Adrian Saran, MD  DATE OF DISCHARGE: 10/11/2014 PRIMARY CARE PHYSICIAN: No primary care provider on file.    ADMISSION DIAGNOSIS:  Pyelonephritis [N12] Duodenal ulcer perforation [K26.5] Sepsis, due to unspecified organism [A41.9]  DISCHARGE DIAGNOSIS:  Active Problems:   Pyelonephritis duodenal ulcer perforation Portal vein thrombosis  SECONDARY DIAGNOSIS:   Past Medical History  Diagnosis Date  . Anxiety   . Bipolar disorder   . Depression     HOSPITAL COURSE:  34 year old male admitted for bilateral flank pain and acute pyelonephritis and subsequently found to have perforated duodenal ulcer and portal vein thrombosis.  1.perforated DU with subsequent PVT: Patient will be transferred STAT to [facility] for emergent surgery. Accepting MD is Dr. Kateri Mc. He is on ZOSYN and will get 400 mg IV DIFLUCAN prior to discharge.  2. Acute pyelonephritis: Patient is on ZOSYN.  3. Sepsis: this is from problem #1. Further management at [facility]  4. hypok and MG: repleted and needs to be rechecked  5. Tobacco abuse: Counselled.   DISCHARGE CONDITIONS AND DIET:  Guarded NPO  CONSULTS OBTAINED:  Treatment Team:  Natale Lay, MD  DRUG ALLERGIES:  No Known Allergies  DISCHARGE MEDICATIONS:   Current Discharge Medication List    CONTINUE these medications which have NOT CHANGED   Details  acetaminophen (TYLENOL) 325 MG tablet Take 1,075 mg by mouth every 6 (six) hours as needed for mild pain, moderate pain or headache.    omeprazole (PRILOSEC) 20 MG capsule Take 20 mg by mouth 2 (two) times daily.               Today   CHIEF COMPLAINT:  B/l flank pain mid epigastric pain  VITAL SIGNS:  Blood pressure 98/54, pulse 98, temperature 98.2 F (36.8 C), temperature source Oral, resp. rate  16, height  (1.88 m), weight 86.183 kg (190 lb), SpO2 94 %.   REVIEW OF SYSTEMS:  Review of Systems  Constitutional: Negative for fever, chills and malaise/fatigue.  HENT: Negative for sore throat.   Eyes: Negative for blurred vision.  Respiratory: Negative for cough, hemoptysis, shortness of breath and wheezing.   Cardiovascular: Negative for chest pain, palpitations and leg swelling.  Gastrointestinal: Negative for nausea, vomiting, abdominal pain, diarrhea and blood in stool.  Genitourinary: Positive for dysuria and flank pain. Negative for urgency, frequency and hematuria.  Musculoskeletal: Positive for back pain.  Neurological: Negative for dizziness, tremors and headaches.  Endo/Heme/Allergies: Does not bruise/bleed easily.     PHYSICAL EXAMINATION:  GENERAL:  34 y.o.-year-old patient lying in the bed with no acute distress.  NECK:  Supple, no jugular venous distention. No thyroid enlargement, no tenderness.  LUNGS: Normal breath sounds bilaterally, no wheezing, rales,rhonchi  No use of accessory muscles of respiration.  CARDIOVASCULAR: S1, S2 normal. No murmurs, rubs, or gallops.  ABDOMEN: Soft, ++ mid epigastric tenderness, non-distended. Bowel sounds present. No organomegaly or mass. No rebound EXTREMITIES: No pedal edema, cyanosis, or clubbing.  PSYCHIATRIC: The patient is alert and oriented x 3.  SKIN: No obvious rash, lesion, or ulcer.  B/L Flank pain DATA REVIEW:   CBC  Recent Labs Lab 10/11/14 0340  WBC 12.2*  HGB 12.5*  HCT 35.8*  PLT 249    Chemistries   Recent Labs Lab 10/11/14 0340  NA 134*  K 2.4*  CL 90*  CO2 27  GLUCOSE 84  BUN 11  CREATININE 0.82  CALCIUM 8.0*  MG 1.4*    Cardiac Enzymes No results for input(s): TROPONINI in the last 168 hours.  Microbiology Results  @[id number] @  RADIOLOGY:  Ct Abdomen Pelvis W Contrast  10/11/2014   ADDENDUM REPORT: 10/11/2014 14:12  ADDENDUM: Critical Value/emergent results were called by  telephone at the time of interpretation on 10/11/2014 at 2:11 pm to Dr. Juliene Pina , who verbally acknowledged these results.   Electronically Signed   By: Esperanza Heir M.D.   On: 10/11/2014 14:12   10/11/2014   CLINICAL DATA:  Possible perforated duodenal ulcer, abdominal pain today  EXAM: CT ABDOMEN AND PELVIS WITH CONTRAST  TECHNIQUE: Multidetector CT imaging of the abdomen and pelvis was performed using the standard protocol following bolus administration of intravenous contrast.  CONTRAST:  OMNIPAQUE IOHEXOL 350 MG/ML SOLN  COMPARISON:  CT scan performed earlier today, 09/02/2014  FINDINGS: Lower chest: Again identified is right lower lobe infiltrate. This could represent atelectasis or pneumonia.  Hepatobiliary: There is thrombus in the main portal vein and in the left main portal vein. There is inflammatory change in the porta hepatis. There are several small bubbles of air in the porta hepatis. The adjacent right lobe of the liver shows a subtle decreased attenuation adjacent to the inflammatory process. There is inflammatory change extending into the subhepatic space and perinephric fossa on the right. There is a small volume of fluid in the gallbladder fossa. There is periportal edema. No gallstones identified.  Pancreas: Inflammatory change in the region of the porta hepatis and pancreatic head limits evaluation of the pancreatic head, but no abnormalities are appreciated.  Spleen: Normal  Adrenals/Urinary Tract: The adrenal glands are normal. The kidneys are normal.  Stomach/Bowel: The duodenal bulb and descending duodenum are difficult to define given the inflammation in the region of the porta hepatis. There appears to be wall thickening of the proximal descending duodenum.  Vascular/Lymphatic: There is portal vein thrombosis. The superior mesenteric vein and splenic vein appear patent. Mild aortoiliac calcification noted.  Reproductive: Normal  Other: No other significant findings  Musculoskeletal:  No acute abnormalities  IMPRESSION: 1. There is inflammatory change with several small bubbles of free air in the region of the porta hepatis. Inflammation obscures delineation of underlying structures, but there does appear to be wall thickening of the proximal descending duodenum. Perforated ulcer is a consideration. There is associated inflammation in the subhepatic space. 2. Portal vein thrombosis as described above with periportal edema and a small volume of free fluid around the gallbladder. 3. Mild low attenuation in deep adjacent right lobe of the liver likely secondary to the inflammatory process  Electronically Signed: By: Esperanza Heir M.D. On: 10/11/2014 14:00   Ct Renal Stone Study  10/11/2014   CLINICAL DATA:  BILATERAL flank pain beginning suddenly last night, severe constant pain, similar symptoms several months ago, admitted for pyelonephritis  EXAM: CT ABDOMEN AND PELVIS WITHOUT CONTRAST  TECHNIQUE: Multidetector CT imaging of the abdomen and pelvis was performed following the standard protocol without IV contrast. Sagittal and coronal MPR images reconstructed from axial data set.  COMPARISON:  09/02/2014  FINDINGS: Bibasilar atelectasis greater in RIGHT lower lobe.  No urinary tract calcification, hydronephrosis or ureteral dilatation.  Liver, spleen, pancreas, kidneys and adrenal glands otherwise unremarkable.  Stomach and bowel loops grossly unremarkable for technique.  Stranding in RIGHT anterior para renal space  extending into periduodenal and peripancreatic fat as well as to the RIGHT lateral conal fascia.  Small foci of extraluminal gas at porta hepatis.  In retrospect, multiple tiny foci of retroperitoneal gas were seen in the anterior pararenal space adjacent to the duodenum on the previous exam.  Findings most consistent with a perforated duodenal ulcer.  No free intraperitoneal air.  No obvious abscess collection though assessment is limited by lack of IV and oral contrast.  Bladder  and ureters unremarkable.  Normal appendix.  Tiny amount of dependent free pelvic fluid.  No mass, adenopathy, hernia or acute bone lesion.  IMPRESSION: Diffuse stranding in the retroperitoneum adjacent to the duodenum and pancreas extending to the RIGHT lateral conal fascia, associated with foci of extraluminal gas at the porta hepatis as well as with in the periduodenal fat on the prior CT.  Findings are most consistent with a perforated duodenal ulcer.  No gross evidence of abscess identified on exam limited by lack of IV and oral contrast.  Findings called to Francee Piccolo RN in [facility] ED on 10/11/2014 at 1105 hours.   Electronically Signed   By: Ulyses Southward M.D.   On: 10/11/2014 11:07      Management plans discussed with the patient and family and  He is in agreement. Stable for discharge [facility]  Patient should follow up with [facility] Dr. Kateri Mc  CODE STATUS:     Code Status Orders        Start     Ordered   10/11/14 1354  Full code   Continuous     10/11/14 1353      TOTAL TIME TAKING CARE OF THIS PATIENT: 40 minutes.    Deon Ivey M.D on 10/11/2014 at 2:38 PM  Between 7am to 6pm - Pager - [phone] After 6pm go to [email or web address] - password [email or web address] [facility]  [facility]  Office  [phone]  CC: Primary care physician; No primary care provider on file.

## 2014-10-11 NOTE — ED Notes (Signed)
K+ 2.4.

## 2014-10-13 LAB — URINE CULTURE: CULTURE: NO GROWTH

## 2014-10-16 LAB — CULTURE, BLOOD (ROUTINE X 2)
CULTURE: NO GROWTH
Culture: NO GROWTH

## 2015-02-03 ENCOUNTER — Emergency Department
Admission: EM | Admit: 2015-02-03 | Discharge: 2015-02-03 | Disposition: A | Discharge status: Home / Self Care | Payer: Self-pay | Attending: Emergency Medicine | Admitting: Emergency Medicine

## 2015-02-03 ENCOUNTER — Encounter: Payer: Self-pay | Admitting: Emergency Medicine

## 2015-02-03 DIAGNOSIS — F17200 Nicotine dependence, unspecified, uncomplicated: Secondary | ICD-10-CM | POA: Insufficient documentation

## 2015-02-03 DIAGNOSIS — F419 Anxiety disorder, unspecified: Secondary | ICD-10-CM | POA: Insufficient documentation

## 2015-02-03 DIAGNOSIS — F3131 Bipolar disorder, current episode depressed, mild: Secondary | ICD-10-CM | POA: Insufficient documentation

## 2015-02-03 DIAGNOSIS — F1110 Opioid abuse, uncomplicated: Secondary | ICD-10-CM | POA: Insufficient documentation

## 2015-02-03 NOTE — ED Provider Notes (Signed)
[facility] Emergency Department Provider Note   ____________________________________________  Time seen:  I have reviewed the triage vital signs and the triage nursing note.  HISTORY  Chief Complaint Medical Clearance   Historian Patient  HPI Tristan Harrington is a 34 y.o. male who reports a history of Suboxone, opiate abuse. He is interested in detox program at residential treatment services. He already discussed with them and they asked him to come to the ED for medical evaluation clearance. At that point he is to follow back up with [facility].  Patient states over the weekend he did take 2 0.5 mg Xanax, but states this is not his drug of choice. He denies drinking alcohol.  States he does have some mild depression, but no suicidal ideation. He is currently feeling some jitteriness and anxiety, but no nausea, vomiting, or pain.    Past Medical History  Diagnosis Date  . Anxiety   . Bipolar disorder (HCC)   . Depression     Patient Active Problem List   Diagnosis Date Noted  . Pyelonephritis 10/11/2014  . Duodenal ulcer perforation (HCC)   . Bipolar disorder (HCC) 08/19/2014  . Depression with anxiety 08/19/2014  . Acute anxiety 08/19/2014    Past Surgical History  Procedure Laterality Date  . Abdominal surgery      No current outpatient prescriptions on file.  Allergies Review of patient's allergies indicates no known allergies.  No family history on file.  Social History Social History  Substance Use Topics  . Smoking status: Current Every Day Smoker -- 0.75 packs/day  . Smokeless tobacco: None  . Alcohol Use: No    Review of Systems  Constitutional: Negative for fever. Eyes: Negative for visual changes. ENT: Negative for sore throat. Cardiovascular: Negative for chest pain. Respiratory: Negative for shortness of breath. Gastrointestinal: Negative for abdominal pain, vomiting and diarrhea. Genitourinary: Negative for  dysuria. Musculoskeletal: Negative for back pain. Skin: Negative for rash. Neurological: Negative for headache. 10 point Review of Systems otherwise negative ____________________________________________   PHYSICAL EXAM:  VITAL SIGNS: ED Triage Vitals  Enc Vitals Group     BP 02/03/15 1426 135/85 mmHg     Pulse Rate 02/03/15 1426 99     Resp 02/03/15 1426 20     Temp 02/03/15 1426 98.4 F (36.9 C)     Temp Source 02/03/15 1426 Oral     SpO2 02/03/15 1426 95 %     Weight 02/03/15 1426 175 lb (79.379 kg)     Height 02/03/15 1426 6\' 1"  (1.854 m)     Head Cir --      Peak Flow --      Pain Score --      Pain Loc --      Pain Edu? --      Excl. in GC? --      Constitutional: Alert and oriented. Well appearing and in no distress. Eyes: Conjunctivae are normal. PERRL. Normal extraocular movements. ENT   Head: Normocephalic and atraumatic.   Nose: No congestion/rhinnorhea.   Mouth/Throat: Mucous membranes are moist.   Neck: No stridor. Cardiovascular/Chest: Normal rate, regular rhythm.  No murmurs, rubs, or gallops. Respiratory: Normal respiratory effort without tachypnea nor retractions. Breath sounds are clear and equal bilaterally. No wheezes/rales/rhonchi. Gastrointestinal: Soft. No distention, no guarding, no rebound. Nontender   Genitourinary/rectal:Deferred Musculoskeletal: Nontender with normal range of motion in all extremities. No joint effusions.  No lower extremity tenderness.  No edema. Neurologic:  Normal speech and language.  No gross or focal neurologic deficits are appreciated. Skin:  Skin is warm, dry and intact. No rash noted. Psychiatric: Mildly depressed mood, normal affect. Speech and behavior are normal. Patient exhibits appropriate insight and judgment. No suicidal ideation  ____________________________________________   EKG I, Governor Rooks, MD, the attending physician have personally viewed and interpreted all ECGs.  No EKG  performed ____________________________________________  LABS (pertinent positives/negatives)  None  ____________________________________________  RADIOLOGY All Xrays were viewed by me. Imaging interpreted by Radiologist.  None __________________________________________  PROCEDURES  Procedure(s) performed: None  Critical Care performed: None  ____________________________________________   ED COURSE / ASSESSMENT AND PLAN  CONSULTATIONS: None  Pertinent labs & imaging results that were available during my care of the patient were reviewed by me and considered in my medical decision making (see chart for details).   Patient has stable vital signs and is stable here. He is no apparent ongoing medical emergencies. He is not in acute withdrawal at this point in time. Does not sound like he uses benzodiazepine or alcohol to be concerned about withdrawal from these substances.  Physical exam is normal. Patient is medically cleared and will be discharged to follow-up with [facility].  Patient / Family / Caregiver informed of clinical course, medical decision-making process, and agree with plan.   I discussed return precautions, follow-up instructions, and discharged instructions with patient and/or family.  ___________________________________________   FINAL CLINICAL IMPRESSION(S) / ED DIAGNOSES   Final diagnoses:  Opiate abuse, continuous       Governor Rooks, MD 02/03/15 1530

## 2015-02-03 NOTE — ED Notes (Signed)
NAD noted at time of D/C. Pt denies questions or concerns. Pt ambulatory to the lobby at this time.  

## 2015-02-03 NOTE — Discharge Instructions (Signed)
You were evaluated for history of Suboxone abuse. You are medically cleared today in the emergency department. Follow-up with [facility] as scheduled for treatment.  Return to the emergency department for any worsening condition including seizure, altered mental status, worsening depression or thoughts of wanting to hurt herself or others.   Polysubstance Abuse When people abuse more than one drug or type of drug it is called polysubstance or polydrug abuse. For example, many smokers also drink alcohol. This is one form of polydrug abuse. Polydrug abuse also refers to the use of a drug to counteract an unpleasant effect produced by another drug. It may also be used to help with withdrawal from another drug. People who take stimulants may become agitated. Sometimes this agitation is countered with a tranquilizer. This helps protect against the unpleasant side effects. Polydrug abuse also refers to the use of different drugs at the same time.  Anytime drug use is interfering with normal living activities, it has become abuse. This includes problems with family and friends. Psychological dependence has developed when your mind tells you that the drug is needed. This is usually followed by physical dependence which has developed when continuing increases of drug are required to get the same feeling or "high". This is known as addiction or chemical dependency. A person's risk is much higher if there is a history of chemical dependency in the family. SIGNS OF CHEMICAL DEPENDENCY  You have been told by friends or family that drugs have become a problem.  You fight when using drugs.  You are having blackouts (not remembering what you do while using).  You feel sick from using drugs but continue using.  You lie about use or amounts of drugs (chemicals) used.  You need chemicals to get you going.  You are suffering in work performance or in school because of drug use.  You get sick from use of drugs but  continue to use anyway.  You need drugs to relate to people or feel comfortable in social situations.  You use drugs to forget problems. "Yes" answered to any of the above signs of chemical dependency indicates there are problems. The longer the use of drugs continues, the greater the problems will become. If there is a family history of drug or alcohol use, it is best not to experiment with these drugs. Continual use leads to tolerance. After tolerance develops more of the drug is needed to get the same feeling. This is followed by addiction. With addiction, drugs become the most important part of life. It becomes more important to take drugs than participate in the other usual activities of life. This includes relating to friends and family. Addiction is followed by dependency. Dependency is a condition where drugs are now needed not just to get high, but to feel normal. Addiction cannot be cured but it can be stopped. This often requires outside help and the care of professionals. Treatment centers are listed in the yellow pages under: Cocaine, Narcotics, and Alcoholics Anonymous. Most hospitals and clinics can refer you to a specialized care center. Talk to your caregiver if you need help.   This information is not intended to replace advice given to you by your health care provider. Make sure you discuss any questions you have with your health care provider.   Document Released: 10/12/2004 Document Revised: 05/15/2011 Document Reviewed: 02/25/2014 Elsevier Interactive Patient Education [employer]2016 [employer]

## 2015-02-03 NOTE — ED Notes (Signed)
Pt states here for medical clearance for [facility], had taken some xanax two days ago.  Denies any thoughts of si or hi

## 2016-07-27 IMAGING — CT CT RENAL STONE PROTOCOL
1 of 2 series · 3 of 32 positions shown, 7 images · non-contrast
Comparison: 09/02/2014

CLINICAL DATA: BILATERAL flank pain beginning suddenly last night,
severe constant pain, similar symptoms several months ago, admitted
for pyelonephritis

EXAM:
CT ABDOMEN AND PELVIS WITHOUT CONTRAST
TECHNIQUE: Multidetector CT imaging of the abdomen and pelvis was performed
following the standard protocol without IV contrast. Sagittal and
coronal MPR images reconstructed from axial data set.

[Series 4: lung windows · axial · 0.68mm/px · z∈[+191,+241]mm · 3 of 21 slices shown, 7 images]
[im 6/21  soft-tissue]
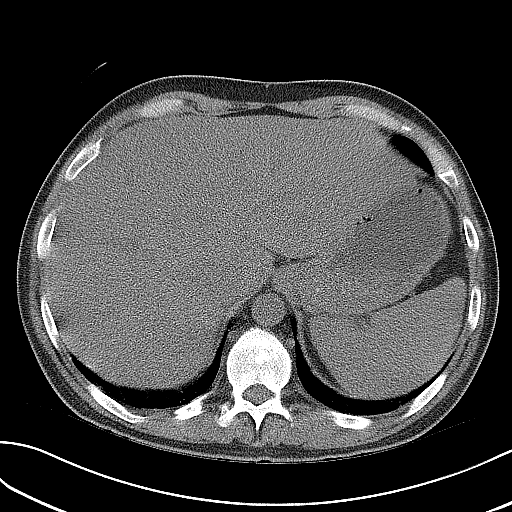
[im 6/21  lung]
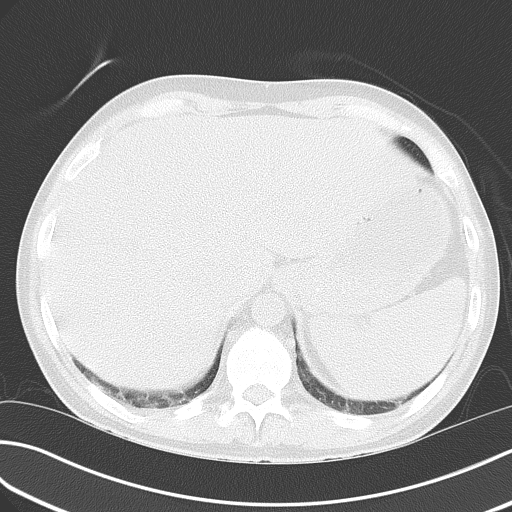
[im 6/21  bone]
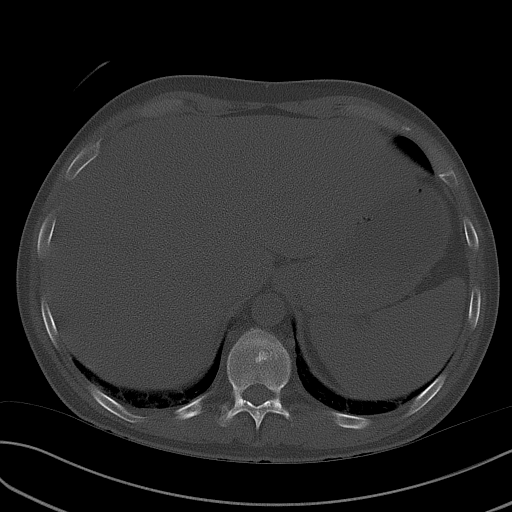
[im 11/21  soft-tissue]
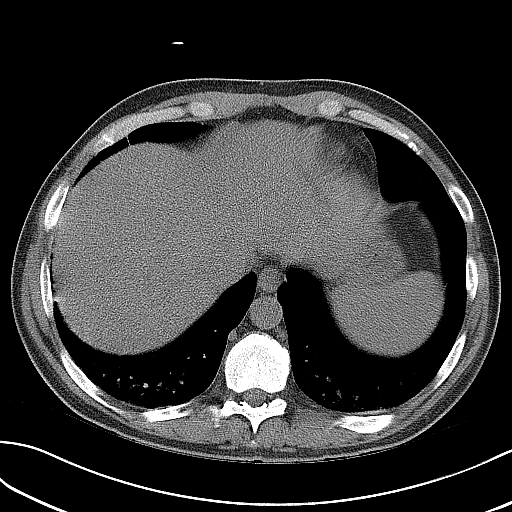
[im 11/21  lung]
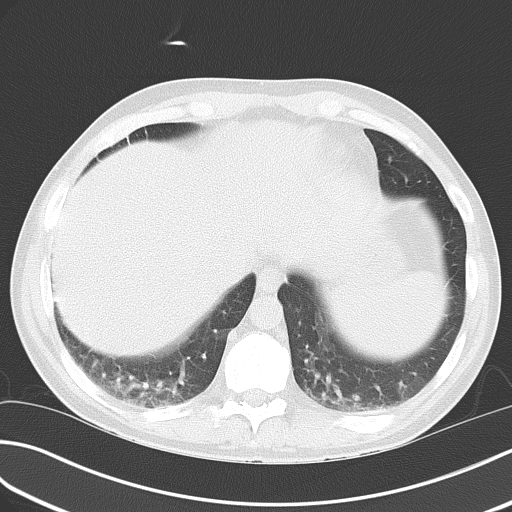
[im 16/21  soft-tissue]
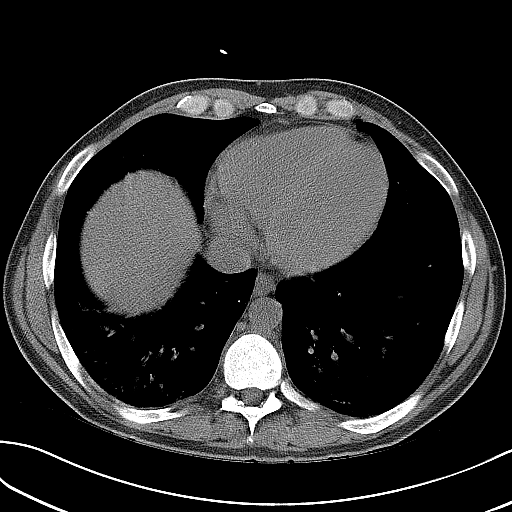
[im 16/21  lung]
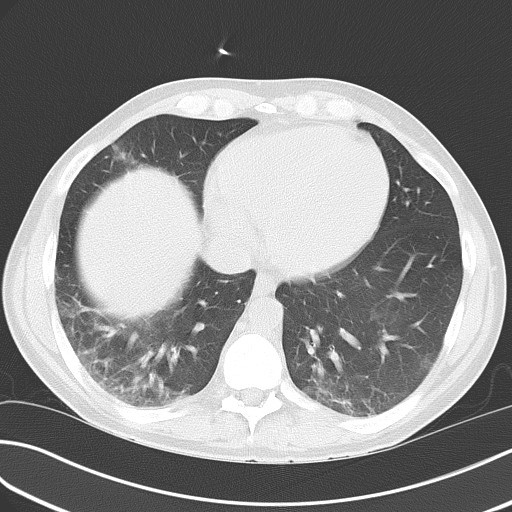

[3 of 32 positions shown; findings below may reference images not displayed]

FINDINGS: Bibasilar atelectasis greater in RIGHT lower lobe.

No urinary tract calcification, hydronephrosis or ureteral
dilatation.

Liver, spleen, pancreas, kidneys and adrenal glands otherwise
unremarkable.

Stomach and bowel loops grossly unremarkable for technique.

Stranding in RIGHT anterior para renal space extending into
periduodenal and peripancreatic fat as well as to the RIGHT lateral
conal fascia.

Small foci of extraluminal gas at porta hepatis.

In retrospect, multiple tiny foci of retroperitoneal gas were seen
in the anterior pararenal space adjacent to the duodenum on the
previous exam.

Findings most consistent with a perforated duodenal ulcer.

No free intraperitoneal air.

No obvious abscess collection though assessment is limited by lack
of IV and oral contrast.

Bladder and ureters unremarkable.

Normal appendix.

Tiny amount of dependent free pelvic fluid.

No mass, adenopathy, hernia or acute bone lesion.
IMPRESSION: Diffuse stranding in the retroperitoneum adjacent to the duodenum
and pancreas extending to the RIGHT lateral conal fascia, associated
with foci of extraluminal gas at the porta hepatis as well as with
in the periduodenal fat on the prior CT.

Findings are most consistent with a perforated duodenal ulcer.

No gross evidence of abscess identified on exam limited by lack of
IV and oral contrast.

Findings called to Tulio Montoya RN in [REDACTED] on 10/11/2014 at 8859 hours.

## 2016-07-27 IMAGING — CT CT ABD-PELV W/ CM
1 of 2 series · 14 of 32 positions shown, 18 images · IV contrast (omnipaque)
Comparison: CT scan performed earlier today, 09/02/2014

ADDENDUM:
Critical Value/emergent results were called by telephone at the time
of interpretation on 10/11/2014 at [DATE] to Dr. Dregg , who verbally
acknowledged these results.
CLINICAL DATA: Possible perforated duodenal ulcer, abdominal pain
today

EXAM:
CT ABDOMEN AND PELVIS WITH CONTRAST
TECHNIQUE: Multidetector CT imaging of the abdomen and pelvis was performed
using the standard protocol following bolus administration of
intravenous contrast.
CONTRAST:  100mL OMNIPAQUE IOHEXOL 350 MG/ML SOLN

[Series 2: routine abd pel with · axial · 0.66mm/px · z∈[-545,-90]mm · 14 of 101 slices shown, 18 images]
[im 5/101  soft-tissue]
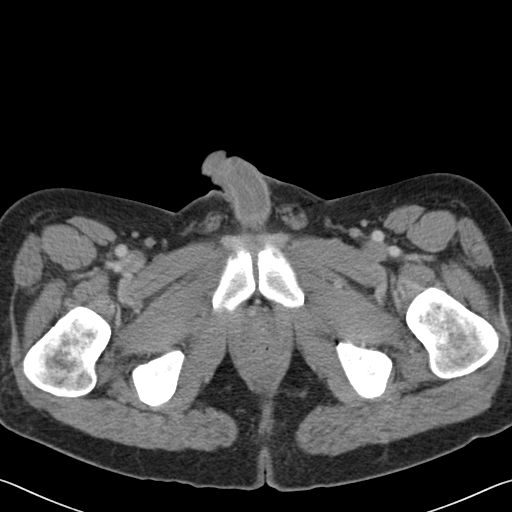
[im 5/101  bone]
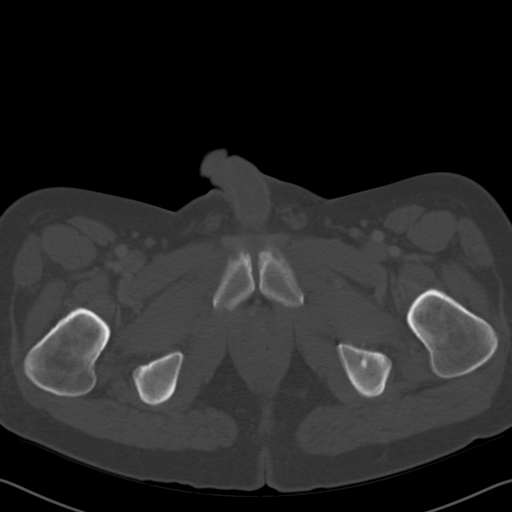
[im 13/101  soft-tissue]
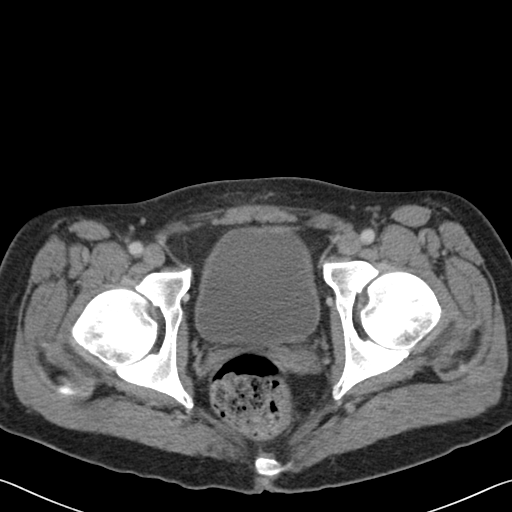
[im 21/101  soft-tissue]
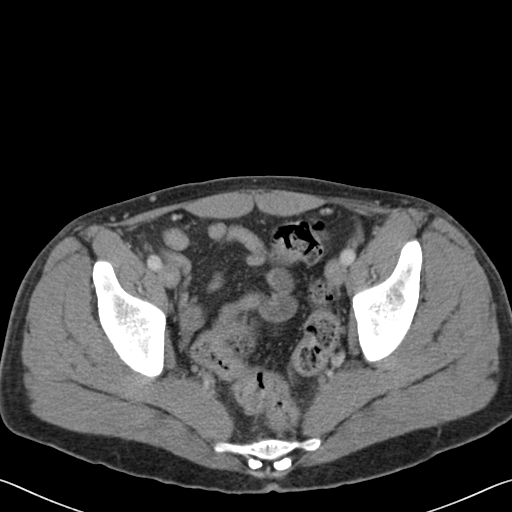
[im 30/101  soft-tissue]
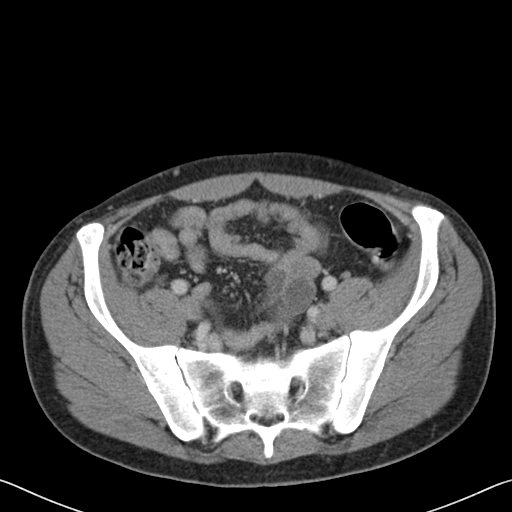
[im 38/101  soft-tissue]
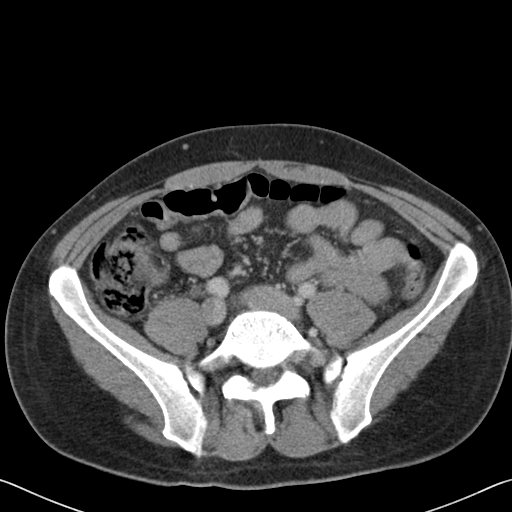
[im 46/101  soft-tissue]
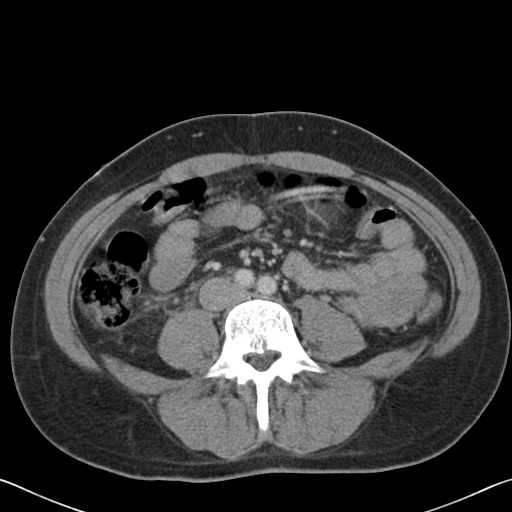
[im 55/101  soft-tissue]
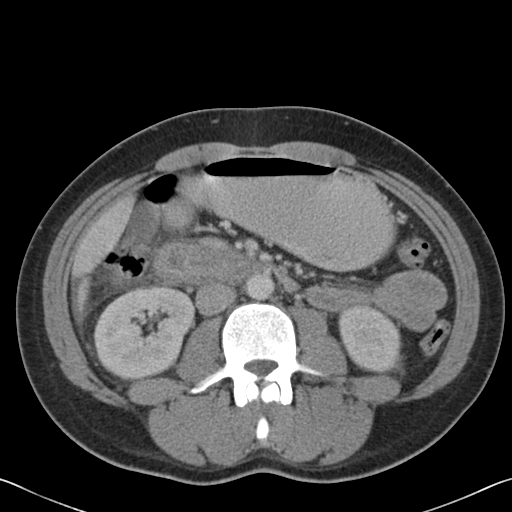
[im 63/101  soft-tissue]
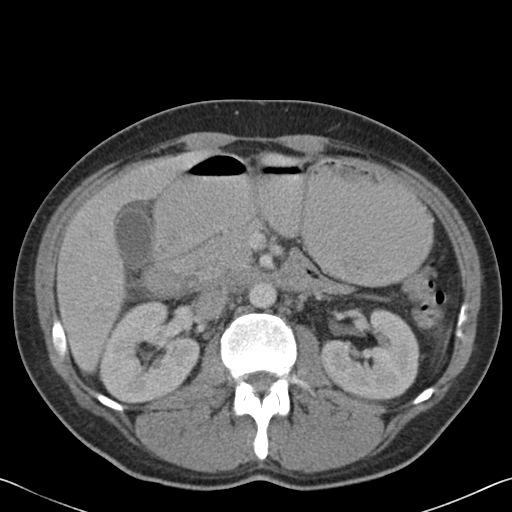
[im 71/101  soft-tissue]
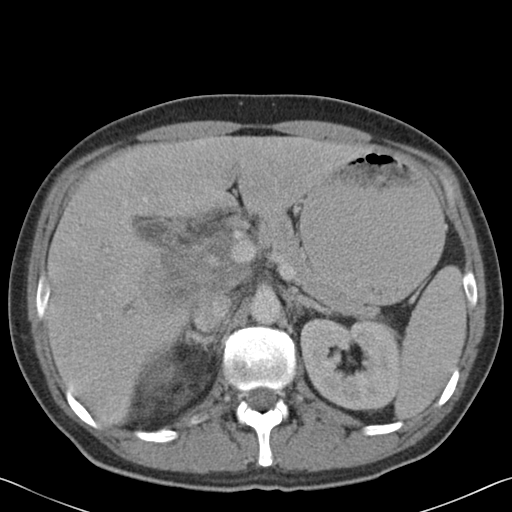
[im 71/101  bone]
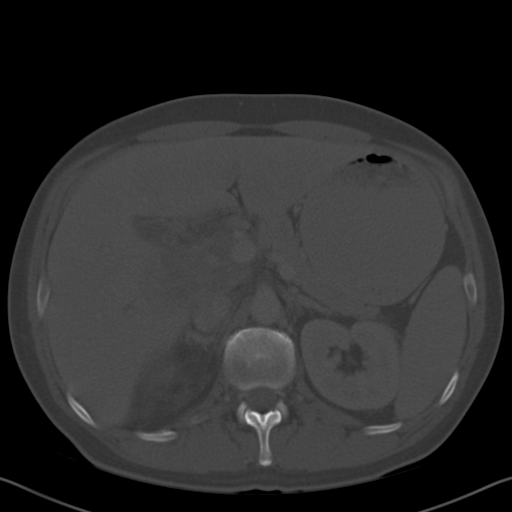
[im 80/101  soft-tissue]
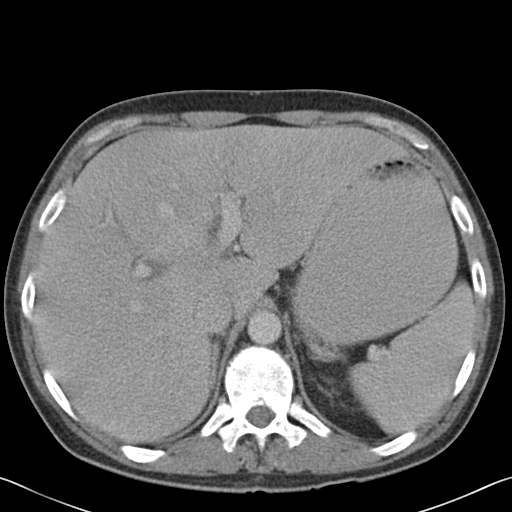
[im 84/101  lung]
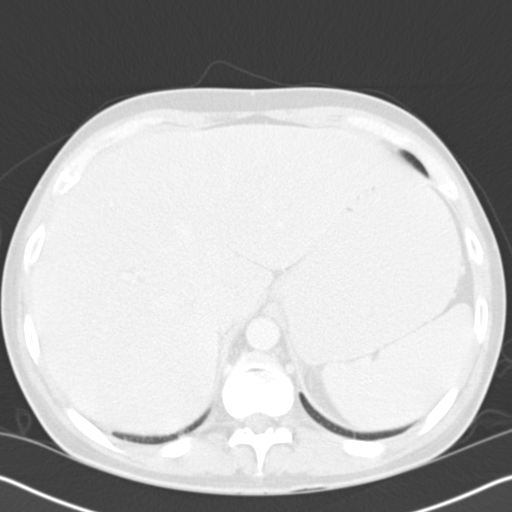
[im 88/101  soft-tissue]
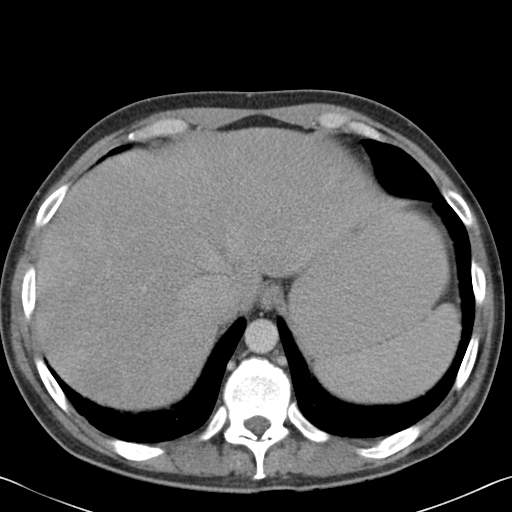
[im 88/101  lung]
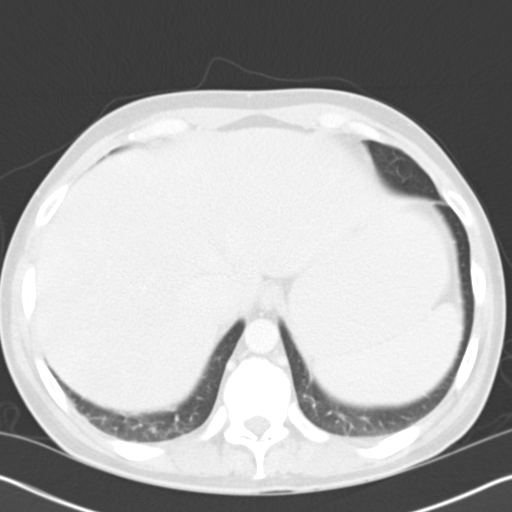
[im 92/101  lung]
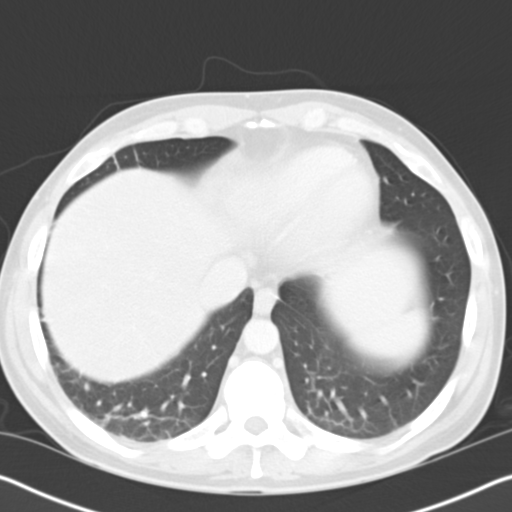
[im 96/101  soft-tissue]
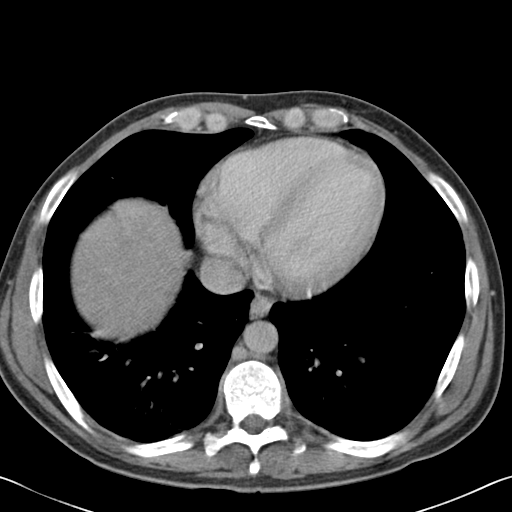
[im 96/101  lung]
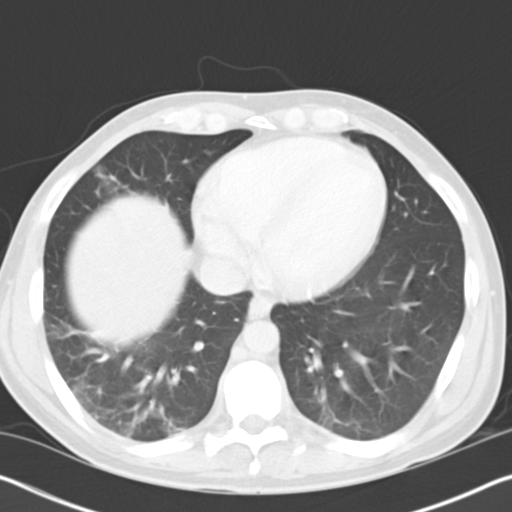

[14 of 32 positions shown; findings below may reference images not displayed]

FINDINGS: Lower chest: Again identified is right lower lobe infiltrate. This
could represent atelectasis or pneumonia.

Hepatobiliary: There is thrombus in the main portal vein and in the
left main portal vein. There is inflammatory change in the porta
hepatis. There are several small bubbles of air in the porta
hepatis. The adjacent right lobe of the liver shows a subtle
decreased attenuation adjacent to the inflammatory process. There is
inflammatory change extending into the subhepatic space and
perinephric fossa on the right. There is a small volume of fluid in
the gallbladder fossa. There is periportal edema. No gallstones
identified.

Pancreas: Inflammatory change in the region of the porta hepatis and
pancreatic head limits evaluation of the pancreatic head, but no
abnormalities are appreciated.

Spleen: Normal

Adrenals/Urinary Tract: The adrenal glands are normal. The kidneys
are normal.

Stomach/Bowel: The duodenal bulb and descending duodenum are
difficult to define given the inflammation in the region of the
porta hepatis. There appears to be wall thickening of the proximal
descending duodenum.

Vascular/Lymphatic: There is portal vein thrombosis. The superior
mesenteric vein and splenic vein appear patent. Mild aortoiliac
calcification noted.

Reproductive: Normal

Other: No other significant findings

Musculoskeletal: No acute abnormalities
IMPRESSION: 1. There is inflammatory change with several small bubbles of free
air in the region of the porta hepatis. Inflammation obscures
delineation of underlying structures, but there does appear to be
wall thickening of the proximal descending duodenum. Perforated
ulcer is a consideration. There is associated inflammation in the
subhepatic space.
2. Portal vein thrombosis as described above with periportal edema
and a small volume of free fluid around the gallbladder.
3. Mild low attenuation in deep adjacent right lobe of the liver
likely secondary to the inflammatory process
